# Patient Record
Sex: Female | Born: 1970 | Race: White | Hispanic: No | Marital: Married | Smoking: Never smoker
Health system: Southern US, Community
[De-identification: ages and names within clinical notes are randomized; demographics above are authoritative.]

## PROBLEM LIST (undated history)

## (undated) DIAGNOSIS — E079 Disorder of thyroid, unspecified: Secondary | ICD-10-CM

## (undated) DIAGNOSIS — F32A Depression, unspecified: Secondary | ICD-10-CM

## (undated) DIAGNOSIS — F329 Major depressive disorder, single episode, unspecified: Secondary | ICD-10-CM

## (undated) HISTORY — PX: APPENDECTOMY: SHX54

## (undated) HISTORY — PX: CHOLECYSTECTOMY: SHX55

---

## 1998-02-09 ENCOUNTER — Inpatient Hospital Stay (HOSPITAL_COMMUNITY): Admission: AD | Admit: 1998-02-09 | Discharge: 1998-02-09 | Payer: Self-pay | Admitting: Gynecology

## 1998-03-12 ENCOUNTER — Encounter: Admission: RE | Admit: 1998-03-12 | Discharge: 1998-06-10 | Payer: Self-pay | Admitting: Gynecology

## 1998-04-24 ENCOUNTER — Inpatient Hospital Stay (HOSPITAL_COMMUNITY): Admission: AD | Admit: 1998-04-24 | Discharge: 1998-04-24 | Payer: Self-pay | Admitting: Gynecology

## 1998-04-24 ENCOUNTER — Observation Stay (HOSPITAL_COMMUNITY): Admission: AD | Admit: 1998-04-24 | Discharge: 1998-04-25 | Payer: Self-pay | Admitting: Obstetrics and Gynecology

## 1998-05-30 ENCOUNTER — Other Ambulatory Visit: Admission: RE | Admit: 1998-05-30 | Discharge: 1998-05-30 | Payer: Self-pay | Admitting: Gynecology

## 1998-05-30 ENCOUNTER — Inpatient Hospital Stay (HOSPITAL_COMMUNITY): Admission: AD | Admit: 1998-05-30 | Discharge: 1998-06-01 | Payer: Self-pay | Admitting: Gynecology

## 1998-06-02 ENCOUNTER — Inpatient Hospital Stay (HOSPITAL_COMMUNITY): Admission: AD | Admit: 1998-06-02 | Discharge: 1998-06-06 | Payer: Self-pay | Admitting: Gynecology

## 1998-06-28 ENCOUNTER — Inpatient Hospital Stay (HOSPITAL_COMMUNITY): Admission: AD | Admit: 1998-06-28 | Discharge: 1998-06-28 | Payer: Self-pay | Admitting: Obstetrics and Gynecology

## 1998-07-03 ENCOUNTER — Inpatient Hospital Stay (HOSPITAL_COMMUNITY): Admission: AD | Admit: 1998-07-03 | Discharge: 1998-07-05 | Payer: Self-pay | Admitting: Obstetrics and Gynecology

## 1998-07-05 ENCOUNTER — Encounter (HOSPITAL_COMMUNITY): Admission: RE | Admit: 1998-07-05 | Discharge: 1998-10-03 | Payer: Self-pay | Admitting: Obstetrics and Gynecology

## 1998-08-12 ENCOUNTER — Other Ambulatory Visit: Admission: RE | Admit: 1998-08-12 | Discharge: 1998-08-12 | Payer: Self-pay | Admitting: Obstetrics and Gynecology

## 1998-10-04 ENCOUNTER — Encounter (HOSPITAL_COMMUNITY): Admission: RE | Admit: 1998-10-04 | Discharge: 1999-01-02 | Payer: Self-pay | Admitting: *Deleted

## 1999-12-19 ENCOUNTER — Other Ambulatory Visit: Admission: RE | Admit: 1999-12-19 | Discharge: 1999-12-19 | Payer: Self-pay | Admitting: Gynecology

## 2000-07-14 ENCOUNTER — Encounter: Admission: RE | Admit: 2000-07-14 | Discharge: 2000-10-12 | Payer: Self-pay | Admitting: Gynecology

## 2000-07-29 ENCOUNTER — Other Ambulatory Visit: Admission: RE | Admit: 2000-07-29 | Discharge: 2000-07-29 | Payer: Self-pay | Admitting: Gynecology

## 2000-11-14 ENCOUNTER — Inpatient Hospital Stay (HOSPITAL_COMMUNITY): Admission: AD | Admit: 2000-11-14 | Discharge: 2000-11-14 | Payer: Self-pay | Admitting: Gynecology

## 2001-02-01 ENCOUNTER — Inpatient Hospital Stay (HOSPITAL_COMMUNITY): Admission: AD | Admit: 2001-02-01 | Discharge: 2001-02-01 | Payer: Self-pay | Admitting: *Deleted

## 2001-02-03 ENCOUNTER — Inpatient Hospital Stay (HOSPITAL_COMMUNITY): Admission: AD | Admit: 2001-02-03 | Discharge: 2001-02-03 | Payer: Self-pay | Admitting: Gynecology

## 2001-02-14 ENCOUNTER — Inpatient Hospital Stay (HOSPITAL_COMMUNITY): Admission: AD | Admit: 2001-02-14 | Discharge: 2001-02-14 | Payer: Self-pay | Admitting: Gynecology

## 2001-02-22 ENCOUNTER — Inpatient Hospital Stay (HOSPITAL_COMMUNITY): Admission: AD | Admit: 2001-02-22 | Discharge: 2001-02-22 | Payer: Self-pay | Admitting: Gynecology

## 2001-03-04 ENCOUNTER — Inpatient Hospital Stay (HOSPITAL_COMMUNITY): Admission: AD | Admit: 2001-03-04 | Discharge: 2001-03-06 | Payer: Self-pay | Admitting: Gynecology

## 2001-04-12 ENCOUNTER — Encounter: Admission: RE | Admit: 2001-04-12 | Discharge: 2001-05-12 | Payer: Self-pay | Admitting: Gynecology

## 2001-04-14 ENCOUNTER — Other Ambulatory Visit: Admission: RE | Admit: 2001-04-14 | Discharge: 2001-04-14 | Payer: Self-pay | Admitting: Gynecology

## 2001-06-12 ENCOUNTER — Encounter: Admission: RE | Admit: 2001-06-12 | Discharge: 2001-07-12 | Payer: Self-pay | Admitting: Gynecology

## 2001-07-13 ENCOUNTER — Encounter: Admission: RE | Admit: 2001-07-13 | Discharge: 2001-08-12 | Payer: Self-pay | Admitting: Gynecology

## 2002-08-03 ENCOUNTER — Other Ambulatory Visit: Admission: RE | Admit: 2002-08-03 | Discharge: 2002-08-03 | Payer: Self-pay | Admitting: Gynecology

## 2010-02-04 ENCOUNTER — Encounter
Admission: RE | Admit: 2010-02-04 | Discharge: 2010-02-04 | Payer: Self-pay | Admitting: Physical Medicine & Rehabilitation

## 2011-03-20 NOTE — Discharge Summary (Signed)
[facility]  PatientSolae Fuller, [place]                           MRN: [id number] Adm. Date:  [id number] Disc. Date: [id number] Attending:  Douglass Rivers Dictator:   Antony Contras, [place]                           Discharge Summary  DISCHARGE DIAGNOSES:          1. Intrauterine pregnancy at 40 weeks.                               2. Advanced cervical dilatation.  PROCEDURES:                   1. Induction of labor secondary to advanced                                  dilatation.                               2. Precipitous vaginal delivery of viable                                  infant over intact perineum.  HISTORY OF PRESENT ILLNESS:   The patient is a 40 year old, gravida 2, para 1-0-0-1 with an LMP of June 01, 2000 and [facility] Mar 08, 2001. Prenatal risk factors include a history of preterm labor, history of bulimia, anxiety and depression. The patient was also taking Paxil 20 mg q.d. during the current pregnancy, and was on terbutaline for preterm labor.  PRENATAL LABORATORY DATA:     Blood type O positive, antibody screen negative. RPR, HBsAg, HIV nonreactive. Toxoplasmosis negative, rubella immune. MSAFP normal. GBS is negative.  HOSPITAL COURSE/TREATMENT:    The patient had presented to the office on a routine visit and found to be 4 cm, 75% effaced with ballotable cervix, and since she does live 40 minutes away, was admitted for induction of labor. She did progress to complete dilatation and then precipitously delivered a viable, Apgar 9/9, 6-pound 15-ounce infant over an intact perineum.  Postpartum course:  She remained afebrile, had no difficulty voiding, and was able to be discharged on her second postpartum day in satisfactory condition. CBC: Hematocrit 38.7, hemoglobin 13.3, WBC 17.4, platelets 199,000.  DISCHARGE FOLLOWUP:           The patient is to follow up in six weeks.  DISCHARGE MEDICATIONS:        The patient is to continue  with prenatal vitamins and iron with Motrin and Tylox for pain. DD:  03/21/01 TD:  03/21/01 Job: [id number]

## 2011-03-20 NOTE — H&P (Signed)
[facility]  PatientLashawnda Fuller, [place]                           MRN: [id number] Adm. Date:  [id number] Disc. Date: [id number] Attending:  Tonye Royalty                         History and Physical  CHIEF COMPLAINT:              A 40-week intrauterine pregnancy, advanced cervical dilation.  HISTORY OF PRESENT ILLNESS:   The patient is a 40 year old, G2, P1-0-0-1, with an LMP of June 01, 2000, estimated date of confinement Mar 08, 2001, estimated gestational age of 39-4/7 weeks, who was noted in the office on a routine examination to be 4 cm dilated, 75% effaced, with a ballottable vertex.  The patients first labor was 4 hours, had she expressed some concerns with regard to her advanced dilation.  She does live 40 minutes away.  The patient is, therefore, being admitted electively for advanced dilation at term.  Her pregnancy was complicated by history of preterm labor which was treated with terbutaline until term.  She has a history of anorexia bulimia as well as depression and did relapse during this pregnancy, and she is currently on Paxil 20 mg.  The remainder of her pregnancy has thus far been uncomplicated. She denies any vaginal bleeding, contractions, and reports good fetal movement.  PRENATAL LABORATORY DATA:     She is O positive, antibody negative, RPR nonreactive, HIV nonreactive, hepatitis B surface antigen nonreactive, toxoplasmosis negative, GBS negative.  Please refer to the [facility] for the complete history.  PHYSICAL EXAMINATION:  GENERAL:                      Well-developed female in no acute distress.  VITAL SIGNS:                  Blood pressure 120/80, weight 172.  HEENT:                        Unremarkable.  NECK:                         Thyroid nontender and mobile.  HEART:                        Regular rate.  LUNGS:                        Clear to auscultation.  BREASTS:                      Without mass,  discharge, or retraction, ______  ABDOMEN:                      Gravid with a fundal height of 38.5.  Soft and nontender.  Fetal heart tones auscultated.  PELVIC:                       On vaginal exam, she is 4, 75, and ballottable.  EXTREMITIES:                  No edema.  Normal reflexes.  ASSESSMENT:  At 39+ weeks, 4 cm dilated, with history of rapid labor in the past.  We will present electively for Pitocin induction as well as AROM.  All questions were addressed.  She will present to the hospital before this time should she go into labor in the interim. DD:  03/02/01 TD:  03/03/01 Job: [id number]

## 2012-10-13 ENCOUNTER — Ambulatory Visit (INDEPENDENT_AMBULATORY_CARE_PROVIDER_SITE_OTHER): Payer: Managed Care, Other (non HMO)

## 2012-10-13 ENCOUNTER — Other Ambulatory Visit: Payer: Self-pay | Admitting: Unknown Physician Specialty

## 2012-10-13 DIAGNOSIS — R1110 Vomiting, unspecified: Secondary | ICD-10-CM

## 2012-10-13 DIAGNOSIS — R109 Unspecified abdominal pain: Secondary | ICD-10-CM

## 2012-10-13 DIAGNOSIS — R52 Pain, unspecified: Secondary | ICD-10-CM

## 2012-10-19 ENCOUNTER — Other Ambulatory Visit: Payer: Self-pay | Admitting: Unknown Physician Specialty

## 2012-10-19 DIAGNOSIS — R52 Pain, unspecified: Secondary | ICD-10-CM

## 2012-10-25 ENCOUNTER — Other Ambulatory Visit: Payer: Managed Care, Other (non HMO)

## 2012-10-28 ENCOUNTER — Ambulatory Visit (INDEPENDENT_AMBULATORY_CARE_PROVIDER_SITE_OTHER): Payer: Managed Care, Other (non HMO)

## 2012-10-28 DIAGNOSIS — R1011 Right upper quadrant pain: Secondary | ICD-10-CM

## 2012-10-28 DIAGNOSIS — K8020 Calculus of gallbladder without cholecystitis without obstruction: Secondary | ICD-10-CM

## 2012-10-28 DIAGNOSIS — R52 Pain, unspecified: Secondary | ICD-10-CM

## 2012-10-28 DIAGNOSIS — R112 Nausea with vomiting, unspecified: Secondary | ICD-10-CM

## 2013-08-29 ENCOUNTER — Emergency Department (HOSPITAL_COMMUNITY)
Admission: EM | Admit: 2013-08-29 | Discharge: 2013-08-29 | Disposition: A | Discharge status: Home / Self Care | Payer: Managed Care, Other (non HMO) | Attending: Emergency Medicine | Admitting: Emergency Medicine

## 2013-08-29 ENCOUNTER — Encounter (HOSPITAL_COMMUNITY): Payer: Self-pay | Admitting: Emergency Medicine

## 2013-08-29 DIAGNOSIS — F3289 Other specified depressive episodes: Secondary | ICD-10-CM | POA: Insufficient documentation

## 2013-08-29 DIAGNOSIS — E079 Disorder of thyroid, unspecified: Secondary | ICD-10-CM | POA: Insufficient documentation

## 2013-08-29 DIAGNOSIS — Z79899 Other long term (current) drug therapy: Secondary | ICD-10-CM | POA: Insufficient documentation

## 2013-08-29 DIAGNOSIS — Z3202 Encounter for pregnancy test, result negative: Secondary | ICD-10-CM | POA: Insufficient documentation

## 2013-08-29 DIAGNOSIS — M7989 Other specified soft tissue disorders: Secondary | ICD-10-CM | POA: Insufficient documentation

## 2013-08-29 DIAGNOSIS — R55 Syncope and collapse: Secondary | ICD-10-CM

## 2013-08-29 DIAGNOSIS — F329 Major depressive disorder, single episode, unspecified: Secondary | ICD-10-CM | POA: Insufficient documentation

## 2013-08-29 DIAGNOSIS — Z880 Allergy status to penicillin: Secondary | ICD-10-CM | POA: Insufficient documentation

## 2013-08-29 HISTORY — DX: Disorder of thyroid, unspecified: E07.9

## 2013-08-29 HISTORY — DX: Major depressive disorder, single episode, unspecified: F32.9

## 2013-08-29 HISTORY — DX: Depression, unspecified: F32.A

## 2013-08-29 LAB — GLUCOSE, CAPILLARY: Glucose-Capillary: 97 mg/dL (ref 70–99)

## 2013-08-29 LAB — POCT I-STAT, CHEM 8
BUN: 19 mg/dL (ref 6–23)
Calcium, Ion: 1.2 mmol/L (ref 1.12–1.23)
Chloride: 100 mEq/L (ref 96–112)
Creatinine, Ser: 1.2 mg/dL — ABNORMAL HIGH (ref 0.50–1.10)
Glucose, Bld: 96 mg/dL (ref 70–99)
HCT: 44 % (ref 36.0–46.0)
Hemoglobin: 15 g/dL (ref 12.0–15.0)
Potassium: 3.7 mEq/L (ref 3.5–5.1)
Sodium: 139 mEq/L (ref 135–145)
TCO2: 28 mmol/L (ref 0–100)

## 2013-08-29 LAB — COMPREHENSIVE METABOLIC PANEL
ALT: 15 U/L (ref 0–35)
AST: 24 U/L (ref 0–37)
Albumin: 4 g/dL (ref 3.5–5.2)
Alkaline Phosphatase: 61 U/L (ref 39–117)
BUN: 18 mg/dL (ref 6–23)
CO2: 29 mEq/L (ref 19–32)
Calcium: 9.6 mg/dL (ref 8.4–10.5)
Chloride: 100 mEq/L (ref 96–112)
Creatinine, Ser: 0.99 mg/dL (ref 0.50–1.10)
GFR calc Af Amer: 80 mL/min — ABNORMAL LOW (ref >90)
GFR calc non Af Amer: 69 mL/min — ABNORMAL LOW (ref >90)
Glucose, Bld: 99 mg/dL (ref 70–99)
Potassium: 3.7 mEq/L (ref 3.5–5.1)
Sodium: 135 mEq/L (ref 135–145)
Total Bilirubin: 0.4 mg/dL (ref 0.3–1.2)
Total Protein: 7.2 g/dL (ref 6.0–8.3)

## 2013-08-29 LAB — CBC
HCT: 39.2 % (ref 36.0–46.0)
Hemoglobin: 13.7 g/dL (ref 12.0–15.0)
MCH: 31.6 pg (ref 26.0–34.0)
MCHC: 34.9 g/dL (ref 30.0–36.0)
MCV: 90.3 fL (ref 78.0–100.0)
Platelets: 229 10*3/uL (ref 150–400)
RBC: 4.34 MIL/uL (ref 3.87–5.11)
RDW: 12.7 % (ref 11.5–15.5)
WBC: 6.4 10*3/uL (ref 4.0–10.5)

## 2013-08-29 LAB — URINALYSIS, ROUTINE W REFLEX MICROSCOPIC
Bilirubin Urine: NEGATIVE
Glucose, UA: NEGATIVE mg/dL
Hgb urine dipstick: NEGATIVE
Ketones, ur: NEGATIVE mg/dL
Leukocytes, UA: NEGATIVE
Nitrite: NEGATIVE
Protein, ur: NEGATIVE mg/dL
Specific Gravity, Urine: 1.011 (ref 1.005–1.030)
Urobilinogen, UA: 0.2 mg/dL (ref 0.0–1.0)
pH: 7 (ref 5.0–8.0)

## 2013-08-29 LAB — POCT PREGNANCY, URINE: Preg Test, Ur: NEGATIVE

## 2013-08-29 NOTE — ED Provider Notes (Signed)
Medical screening examination/treatment/procedure(s) were performed by non-physician practitioner and as supervising physician I was immediately available for consultation/collaboration.  EKG Interpretation   None        Raeford Razor, MD 08/29/13 [phone]

## 2013-08-29 NOTE — ED Provider Notes (Signed)
CSN: [id number]     Arrival date & time 08/29/13  1146 History   First MD Initiated Contact with Patient 08/29/13 1324     Chief Complaint  Patient presents with  . Loss of Consciousness   (Consider location/radiation/quality/duration/timing/severity/associated sxs/prior Treatment) Patient is a 42 y.o. female presenting with syncope. The history is provided by the patient. No language interpreter was used.  Loss of Consciousness Episode history:  Multiple Most recent episode:  Today Associated symptoms: no chest pain, no fever, no headaches, no nausea, no shortness of breath, no vomiting and no weakness   Associated symptoms comment:  She received the flu shot today. Per the patient, there was a large amount of bleeding associated with the injection and she subsequently passed out. She woke and then passed out again. She states the same thing occurred last time she received the shot as well. No pain. No dizziness prior to getting the injection. She now complains only of swelling without pain at the site in the right upper arm. No nausea, vomiting, headache, chest pain or shortness of breath.    Past Medical History  Diagnosis Date  . Thyroid disease   . Depression    Past Surgical History  Procedure Laterality Date  . Cholecystectomy    . Appendectomy     No family history on file. History  Substance Use Topics  . Smoking status: Never Smoker   . Smokeless tobacco: Not on file  . Alcohol Use: No   OB History   Grav Para Term Preterm Abortions TAB SAB Ect Mult Living                 Review of Systems  Constitutional: Negative for fever and chills.  HENT: Negative.   Eyes: Negative.  Negative for visual disturbance.  Respiratory: Negative.  Negative for shortness of breath.   Cardiovascular: Positive for syncope. Negative for chest pain.  Gastrointestinal: Negative.  Negative for nausea, vomiting and abdominal pain.  Musculoskeletal: Negative.  Negative for neck pain.   Skin: Negative.  Negative for rash.  Neurological: Positive for syncope. Negative for weakness and headaches.    Allergies  Penicillins  Home Medications   Current Outpatient Rx  Name  Route  Sig  Dispense  Refill  . BIOTIN PO   Oral   Take 1 tablet by mouth daily.         Marland Kitchen levothyroxine (SYNTHROID, LEVOTHROID) 25 MCG tablet   Oral   Take 1 tablet by mouth daily.         . Misc Natural Products (CORTISOL PO)   Oral   Take 1 tablet by mouth 2 (two) times daily.         Marland Kitchen venlafaxine (EFFEXOR) 75 MG tablet   Oral   Take 1 tablet by mouth daily.          BP 118/68  Pulse 76  Temp(Src) 98.6 F (37 C) (Oral)  Resp 18  SpO2 99%  LMP 08/15/2013 Physical Exam  Constitutional: She is oriented to person, place, and time. She appears well-developed and well-nourished.  HENT:  Head: Normocephalic.  Eyes: Pupils are equal, round, and reactive to light.  Neck: Normal range of motion. Neck supple.  Cardiovascular: Normal rate and regular rhythm.   Pulmonary/Chest: Effort normal and breath sounds normal.  Abdominal: Soft. Bowel sounds are normal. There is no tenderness. There is no rebound and no guarding.  Musculoskeletal: Normal range of motion.  Neurological: She is alert and oriented to  person, place, and time. She has normal strength and normal reflexes. No sensory deficit. She displays a negative Romberg sign. Coordination normal.  Skin: Skin is warm and dry. No rash noted.  Psychiatric: She has a normal mood and affect.    ED Course  Procedures (including critical care time) Labs Review Labs Reviewed  COMPREHENSIVE METABOLIC PANEL - Abnormal; Notable for the following:    GFR calc non Af Amer 69 (*)    GFR calc Af Amer 80 (*)    All other components within normal limits  POCT I-STAT, CHEM 8 - Abnormal; Notable for the following:    Creatinine, Ser 1.20 (*)    All other components within normal limits  CBC  URINALYSIS, ROUTINE W REFLEX MICROSCOPIC   GLUCOSE, CAPILLARY  POCT PREGNANCY, URINE   Results for orders placed during the hospital encounter of 08/29/13  CBC      Result Value Range   WBC 6.4  4.0 - 10.5 K/uL   RBC 4.34  3.87 - 5.11 MIL/uL   Hemoglobin 13.7  12.0 - 15.0 g/dL   HCT 16.1  09.6 - 04.5 %   MCV 90.3  78.0 - 100.0 fL   MCH 31.6  26.0 - 34.0 pg   MCHC 34.9  30.0 - 36.0 g/dL   RDW 40.9  81.1 - 91.4 %   Platelets 229  150 - 400 K/uL  COMPREHENSIVE METABOLIC PANEL      Result Value Range   Sodium 135  135 - 145 mEq/L   Potassium 3.7  3.5 - 5.1 mEq/L   Chloride 100  96 - 112 mEq/L   CO2 29  19 - 32 mEq/L   Glucose, Bld 99  70 - 99 mg/dL   BUN 18  6 - 23 mg/dL   Creatinine, Ser 7.82  0.50 - 1.10 mg/dL   Calcium 9.6  8.4 - 95.6 mg/dL   Total Protein 7.2  6.0 - 8.3 g/dL   Albumin 4.0  3.5 - 5.2 g/dL   AST 24  0 - 37 U/L   ALT 15  0 - 35 U/L   Alkaline Phosphatase 61  39 - 117 U/L   Total Bilirubin 0.4  0.3 - 1.2 mg/dL   GFR calc non Af Amer 69 (*) >90 mL/min   GFR calc Af Amer 80 (*) >90 mL/min  URINALYSIS, ROUTINE W REFLEX MICROSCOPIC      Result Value Range   Color, Urine YELLOW  YELLOW   APPearance CLEAR  CLEAR   Specific Gravity, Urine 1.011  1.005 - 1.030   pH 7.0  5.0 - 8.0   Glucose, UA NEGATIVE  NEGATIVE mg/dL   Hgb urine dipstick NEGATIVE  NEGATIVE   Bilirubin Urine NEGATIVE  NEGATIVE   Ketones, ur NEGATIVE  NEGATIVE mg/dL   Protein, ur NEGATIVE  NEGATIVE mg/dL   Urobilinogen, UA 0.2  0.0 - 1.0 mg/dL   Nitrite NEGATIVE  NEGATIVE   Leukocytes, UA NEGATIVE  NEGATIVE  GLUCOSE, CAPILLARY      Result Value Range   Glucose-Capillary 97  70 - 99 mg/dL   Comment 1 Notify RN     Comment 2 Documented in Chart    POCT PREGNANCY, URINE      Result Value Range   Preg Test, Ur NEGATIVE  NEGATIVE  POCT I-STAT, CHEM 8      Result Value Range   Sodium 139  135 - 145 mEq/L   Potassium 3.7  3.5 - 5.1 mEq/L  Chloride 100  96 - 112 mEq/L   BUN 19  6 - 23 mg/dL   Creatinine, Ser 9.56 (*) 0.50 - 1.10  mg/dL   Glucose, Bld 96  70 - 99 mg/dL   Calcium, Ion 2.13  1.12 - 1.23 mmol/L   TCO2 28  0 - 100 mmol/L   Hemoglobin 15.0  12.0 - 15.0 g/dL   HCT 08.6  57.8 - 46.9 %  No results found.   Imaging Review No results found.  EKG Interpretation   None      Date: 08/29/2013  Rate: 55  Rhythm: sinus bradycardia  QRS Axis: normal  Intervals: normal  ST/T Wave abnormalities: nonspecific T wave changes  Conduction Disutrbances:none  Narrative Interpretation:   Old EKG Reviewed: none available    MDM  No diagnosis found. 1. Syncope  She feels back to baseline now, no orthostasis, ambulatory without symptoms of dizziness, near syncope, or lightheadedness. She still has swelling at the shot injection site that is nontender. Reassured that this is felt to be localized reaction only.     Arnoldo Hooker, PA-C 08/29/13 1448

## 2013-08-29 NOTE — Progress Notes (Signed)
   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

## 2013-08-29 NOTE — ED Notes (Signed)
Per EMS: was called out for allergic reaction. Pt had flu shot 15 mins before EMS arrived. After the shot blood flew out of arm, and she passed out. Saff states pt passed out twice. Pt had no allergic reaction symptoms. NAD. EMS states no orthostatic changes.

## 2013-08-29 NOTE — ED Notes (Signed)
Bed: WA02 Expected date:  Expected time:  Means of arrival:  Comments: EMS allergic reaction 

## 2014-10-05 ENCOUNTER — Other Ambulatory Visit: Payer: Self-pay | Admitting: Unknown Physician Specialty

## 2014-10-05 DIAGNOSIS — D259 Leiomyoma of uterus, unspecified: Secondary | ICD-10-CM

## 2014-10-08 ENCOUNTER — Other Ambulatory Visit: Payer: Self-pay | Admitting: Unknown Physician Specialty

## 2014-10-08 DIAGNOSIS — D259 Leiomyoma of uterus, unspecified: Secondary | ICD-10-CM

## 2014-10-17 ENCOUNTER — Ambulatory Visit (INDEPENDENT_AMBULATORY_CARE_PROVIDER_SITE_OTHER): Payer: Managed Care, Other (non HMO)

## 2014-10-17 DIAGNOSIS — D251 Intramural leiomyoma of uterus: Secondary | ICD-10-CM

## 2014-10-17 DIAGNOSIS — D259 Leiomyoma of uterus, unspecified: Secondary | ICD-10-CM

## 2016-10-07 DIAGNOSIS — M25512 Pain in left shoulder: Secondary | ICD-10-CM | POA: Diagnosis not present

## 2016-10-20 DIAGNOSIS — M25512 Pain in left shoulder: Secondary | ICD-10-CM | POA: Diagnosis not present

## 2016-10-30 DIAGNOSIS — M25512 Pain in left shoulder: Secondary | ICD-10-CM | POA: Diagnosis not present

## 2016-11-12 DIAGNOSIS — J069 Acute upper respiratory infection, unspecified: Secondary | ICD-10-CM | POA: Diagnosis not present

## 2016-11-12 DIAGNOSIS — J9801 Acute bronchospasm: Secondary | ICD-10-CM | POA: Diagnosis not present

## 2016-12-01 ENCOUNTER — Other Ambulatory Visit: Payer: Self-pay | Admitting: Unknown Physician Specialty

## 2016-12-01 DIAGNOSIS — E041 Nontoxic single thyroid nodule: Secondary | ICD-10-CM

## 2016-12-02 DIAGNOSIS — Z79899 Other long term (current) drug therapy: Secondary | ICD-10-CM | POA: Diagnosis not present

## 2016-12-02 DIAGNOSIS — Z880 Allergy status to penicillin: Secondary | ICD-10-CM | POA: Diagnosis not present

## 2016-12-02 DIAGNOSIS — R002 Palpitations: Secondary | ICD-10-CM | POA: Diagnosis not present

## 2016-12-02 DIAGNOSIS — M47812 Spondylosis without myelopathy or radiculopathy, cervical region: Secondary | ICD-10-CM | POA: Diagnosis not present

## 2016-12-02 DIAGNOSIS — F329 Major depressive disorder, single episode, unspecified: Secondary | ICD-10-CM | POA: Diagnosis not present

## 2016-12-02 DIAGNOSIS — R0609 Other forms of dyspnea: Secondary | ICD-10-CM | POA: Diagnosis not present

## 2016-12-02 DIAGNOSIS — E039 Hypothyroidism, unspecified: Secondary | ICD-10-CM | POA: Diagnosis not present

## 2016-12-02 DIAGNOSIS — R42 Dizziness and giddiness: Secondary | ICD-10-CM | POA: Diagnosis not present

## 2016-12-02 DIAGNOSIS — R531 Weakness: Secondary | ICD-10-CM | POA: Diagnosis not present

## 2016-12-03 ENCOUNTER — Ambulatory Visit: Payer: BLUE CROSS/BLUE SHIELD

## 2016-12-03 DIAGNOSIS — E041 Nontoxic single thyroid nodule: Secondary | ICD-10-CM

## 2016-12-03 DIAGNOSIS — E042 Nontoxic multinodular goiter: Secondary | ICD-10-CM | POA: Diagnosis not present

## 2017-01-07 DIAGNOSIS — L501 Idiopathic urticaria: Secondary | ICD-10-CM | POA: Diagnosis not present

## 2017-01-07 DIAGNOSIS — R221 Localized swelling, mass and lump, neck: Secondary | ICD-10-CM | POA: Diagnosis not present

## 2017-01-07 DIAGNOSIS — E063 Autoimmune thyroiditis: Secondary | ICD-10-CM | POA: Diagnosis not present

## 2017-03-11 DIAGNOSIS — Z8719 Personal history of other diseases of the digestive system: Secondary | ICD-10-CM | POA: Diagnosis not present

## 2017-03-11 DIAGNOSIS — Z9049 Acquired absence of other specified parts of digestive tract: Secondary | ICD-10-CM | POA: Diagnosis not present

## 2017-03-11 DIAGNOSIS — K2900 Acute gastritis without bleeding: Secondary | ICD-10-CM | POA: Diagnosis not present

## 2017-03-11 DIAGNOSIS — R1013 Epigastric pain: Secondary | ICD-10-CM | POA: Diagnosis not present

## 2017-03-11 DIAGNOSIS — K2970 Gastritis, unspecified, without bleeding: Secondary | ICD-10-CM | POA: Diagnosis not present

## 2017-03-11 DIAGNOSIS — K769 Liver disease, unspecified: Secondary | ICD-10-CM | POA: Diagnosis not present

## 2017-03-11 DIAGNOSIS — R9431 Abnormal electrocardiogram [ECG] [EKG]: Secondary | ICD-10-CM | POA: Diagnosis not present

## 2017-03-11 DIAGNOSIS — R946 Abnormal results of thyroid function studies: Secondary | ICD-10-CM | POA: Diagnosis not present

## 2017-03-11 DIAGNOSIS — Z880 Allergy status to penicillin: Secondary | ICD-10-CM | POA: Diagnosis not present

## 2017-03-11 DIAGNOSIS — R001 Bradycardia, unspecified: Secondary | ICD-10-CM | POA: Diagnosis not present

## 2017-03-11 DIAGNOSIS — R079 Chest pain, unspecified: Secondary | ICD-10-CM | POA: Diagnosis not present

## 2017-03-12 DIAGNOSIS — K3189 Other diseases of stomach and duodenum: Secondary | ICD-10-CM | POA: Diagnosis not present

## 2017-03-12 DIAGNOSIS — K319 Disease of stomach and duodenum, unspecified: Secondary | ICD-10-CM | POA: Diagnosis not present

## 2017-03-12 DIAGNOSIS — K769 Liver disease, unspecified: Secondary | ICD-10-CM | POA: Diagnosis not present

## 2017-03-12 DIAGNOSIS — K2970 Gastritis, unspecified, without bleeding: Secondary | ICD-10-CM | POA: Diagnosis not present

## 2017-03-12 DIAGNOSIS — R1013 Epigastric pain: Secondary | ICD-10-CM | POA: Diagnosis not present

## 2017-03-12 DIAGNOSIS — K5900 Constipation, unspecified: Secondary | ICD-10-CM | POA: Diagnosis not present

## 2017-03-12 DIAGNOSIS — Z9049 Acquired absence of other specified parts of digestive tract: Secondary | ICD-10-CM | POA: Diagnosis not present

## 2017-03-20 DIAGNOSIS — H00012 Hordeolum externum right lower eyelid: Secondary | ICD-10-CM | POA: Diagnosis not present

## 2017-04-06 DIAGNOSIS — R1013 Epigastric pain: Secondary | ICD-10-CM | POA: Diagnosis not present

## 2017-04-06 DIAGNOSIS — R0789 Other chest pain: Secondary | ICD-10-CM | POA: Diagnosis not present

## 2017-04-07 DIAGNOSIS — F900 Attention-deficit hyperactivity disorder, predominantly inattentive type: Secondary | ICD-10-CM | POA: Diagnosis not present

## 2017-04-07 DIAGNOSIS — H00014 Hordeolum externum left upper eyelid: Secondary | ICD-10-CM | POA: Diagnosis not present

## 2017-04-07 DIAGNOSIS — M5030 Other cervical disc degeneration, unspecified cervical region: Secondary | ICD-10-CM | POA: Diagnosis not present

## 2017-04-07 DIAGNOSIS — J309 Allergic rhinitis, unspecified: Secondary | ICD-10-CM | POA: Diagnosis not present

## 2017-04-20 DIAGNOSIS — L570 Actinic keratosis: Secondary | ICD-10-CM | POA: Diagnosis not present

## 2017-04-23 DIAGNOSIS — H00024 Hordeolum internum left upper eyelid: Secondary | ICD-10-CM | POA: Diagnosis not present

## 2017-04-27 DIAGNOSIS — H00024 Hordeolum internum left upper eyelid: Secondary | ICD-10-CM | POA: Diagnosis not present

## 2017-05-19 DIAGNOSIS — J069 Acute upper respiratory infection, unspecified: Secondary | ICD-10-CM | POA: Diagnosis not present

## 2017-05-19 DIAGNOSIS — J9801 Acute bronchospasm: Secondary | ICD-10-CM | POA: Diagnosis not present

## 2017-06-01 DIAGNOSIS — J069 Acute upper respiratory infection, unspecified: Secondary | ICD-10-CM | POA: Diagnosis not present

## 2017-06-01 DIAGNOSIS — J9801 Acute bronchospasm: Secondary | ICD-10-CM | POA: Diagnosis not present

## 2017-06-17 DIAGNOSIS — L570 Actinic keratosis: Secondary | ICD-10-CM | POA: Diagnosis not present

## 2017-07-08 DIAGNOSIS — K2900 Acute gastritis without bleeding: Secondary | ICD-10-CM | POA: Diagnosis not present

## 2017-07-08 DIAGNOSIS — K8020 Calculus of gallbladder without cholecystitis without obstruction: Secondary | ICD-10-CM | POA: Diagnosis not present

## 2017-07-08 DIAGNOSIS — R1013 Epigastric pain: Secondary | ICD-10-CM | POA: Diagnosis not present

## 2017-07-12 DIAGNOSIS — L501 Idiopathic urticaria: Secondary | ICD-10-CM | POA: Diagnosis not present

## 2017-07-12 DIAGNOSIS — E063 Autoimmune thyroiditis: Secondary | ICD-10-CM | POA: Diagnosis not present

## 2017-08-10 DIAGNOSIS — L570 Actinic keratosis: Secondary | ICD-10-CM | POA: Diagnosis not present

## 2017-09-08 DIAGNOSIS — M5412 Radiculopathy, cervical region: Secondary | ICD-10-CM | POA: Diagnosis not present

## 2017-09-08 DIAGNOSIS — T481X5A Adverse effect of skeletal muscle relaxants [neuromuscular blocking agents], initial encounter: Secondary | ICD-10-CM | POA: Diagnosis not present

## 2017-09-16 DIAGNOSIS — H0014 Chalazion left upper eyelid: Secondary | ICD-10-CM | POA: Diagnosis not present

## 2017-09-28 DIAGNOSIS — H0014 Chalazion left upper eyelid: Secondary | ICD-10-CM | POA: Diagnosis not present

## 2017-10-05 DIAGNOSIS — H0014 Chalazion left upper eyelid: Secondary | ICD-10-CM | POA: Diagnosis not present

## 2017-10-14 DIAGNOSIS — Z9882 Breast implant status: Secondary | ICD-10-CM | POA: Diagnosis not present

## 2017-10-14 DIAGNOSIS — H0014 Chalazion left upper eyelid: Secondary | ICD-10-CM | POA: Diagnosis not present

## 2017-10-28 DIAGNOSIS — E041 Nontoxic single thyroid nodule: Secondary | ICD-10-CM | POA: Diagnosis not present

## 2017-10-28 DIAGNOSIS — F900 Attention-deficit hyperactivity disorder, predominantly inattentive type: Secondary | ICD-10-CM | POA: Diagnosis not present

## 2017-10-28 DIAGNOSIS — R635 Abnormal weight gain: Secondary | ICD-10-CM | POA: Diagnosis not present

## 2017-10-28 DIAGNOSIS — M5030 Other cervical disc degeneration, unspecified cervical region: Secondary | ICD-10-CM | POA: Diagnosis not present

## 2017-10-28 DIAGNOSIS — Z23 Encounter for immunization: Secondary | ICD-10-CM | POA: Diagnosis not present

## 2017-10-28 DIAGNOSIS — Z1231 Encounter for screening mammogram for malignant neoplasm of breast: Secondary | ICD-10-CM | POA: Diagnosis not present

## 2017-10-28 DIAGNOSIS — Z0000 Encounter for general adult medical examination without abnormal findings: Secondary | ICD-10-CM | POA: Diagnosis not present

## 2017-10-29 DIAGNOSIS — Z1231 Encounter for screening mammogram for malignant neoplasm of breast: Secondary | ICD-10-CM | POA: Diagnosis not present

## 2018-01-10 DIAGNOSIS — R221 Localized swelling, mass and lump, neck: Secondary | ICD-10-CM | POA: Diagnosis not present

## 2018-01-10 DIAGNOSIS — E063 Autoimmune thyroiditis: Secondary | ICD-10-CM | POA: Diagnosis not present

## 2018-01-10 DIAGNOSIS — L501 Idiopathic urticaria: Secondary | ICD-10-CM | POA: Diagnosis not present

## 2018-01-24 DIAGNOSIS — Z23 Encounter for immunization: Secondary | ICD-10-CM | POA: Diagnosis not present

## 2018-04-19 DIAGNOSIS — M5030 Other cervical disc degeneration, unspecified cervical region: Secondary | ICD-10-CM | POA: Diagnosis not present

## 2018-04-19 DIAGNOSIS — E041 Nontoxic single thyroid nodule: Secondary | ICD-10-CM | POA: Diagnosis not present

## 2018-04-19 DIAGNOSIS — F900 Attention-deficit hyperactivity disorder, predominantly inattentive type: Secondary | ICD-10-CM | POA: Diagnosis not present

## 2018-04-19 DIAGNOSIS — E7800 Pure hypercholesterolemia, unspecified: Secondary | ICD-10-CM | POA: Diagnosis not present

## 2018-04-22 DIAGNOSIS — E7800 Pure hypercholesterolemia, unspecified: Secondary | ICD-10-CM | POA: Diagnosis not present

## 2018-04-22 DIAGNOSIS — L309 Dermatitis, unspecified: Secondary | ICD-10-CM | POA: Diagnosis not present

## 2018-04-22 DIAGNOSIS — F900 Attention-deficit hyperactivity disorder, predominantly inattentive type: Secondary | ICD-10-CM | POA: Diagnosis not present

## 2018-04-22 DIAGNOSIS — J309 Allergic rhinitis, unspecified: Secondary | ICD-10-CM | POA: Diagnosis not present

## 2018-05-31 DIAGNOSIS — D229 Melanocytic nevi, unspecified: Secondary | ICD-10-CM | POA: Diagnosis not present

## 2018-07-13 DIAGNOSIS — E063 Autoimmune thyroiditis: Secondary | ICD-10-CM | POA: Diagnosis not present

## 2018-07-13 DIAGNOSIS — L501 Idiopathic urticaria: Secondary | ICD-10-CM | POA: Diagnosis not present

## 2018-07-13 DIAGNOSIS — R221 Localized swelling, mass and lump, neck: Secondary | ICD-10-CM | POA: Diagnosis not present

## 2018-08-04 DIAGNOSIS — F509 Eating disorder, unspecified: Secondary | ICD-10-CM | POA: Diagnosis not present

## 2018-08-09 DIAGNOSIS — F509 Eating disorder, unspecified: Secondary | ICD-10-CM | POA: Diagnosis not present

## 2018-09-13 DIAGNOSIS — G479 Sleep disorder, unspecified: Secondary | ICD-10-CM | POA: Diagnosis not present

## 2018-09-13 DIAGNOSIS — F418 Other specified anxiety disorders: Secondary | ICD-10-CM | POA: Diagnosis not present

## 2018-09-13 DIAGNOSIS — F502 Bulimia nervosa: Secondary | ICD-10-CM | POA: Diagnosis not present

## 2018-09-22 DIAGNOSIS — F3340 Major depressive disorder, recurrent, in remission, unspecified: Secondary | ICD-10-CM | POA: Diagnosis not present

## 2018-09-22 DIAGNOSIS — F4310 Post-traumatic stress disorder, unspecified: Secondary | ICD-10-CM | POA: Diagnosis not present

## 2018-10-07 DIAGNOSIS — Z01419 Encounter for gynecological examination (general) (routine) without abnormal findings: Secondary | ICD-10-CM | POA: Diagnosis not present

## 2018-10-07 DIAGNOSIS — Z0000 Encounter for general adult medical examination without abnormal findings: Secondary | ICD-10-CM | POA: Diagnosis not present

## 2018-10-07 DIAGNOSIS — Z1231 Encounter for screening mammogram for malignant neoplasm of breast: Secondary | ICD-10-CM | POA: Diagnosis not present

## 2018-11-11 DIAGNOSIS — F3340 Major depressive disorder, recurrent, in remission, unspecified: Secondary | ICD-10-CM | POA: Diagnosis not present

## 2018-11-11 DIAGNOSIS — F4310 Post-traumatic stress disorder, unspecified: Secondary | ICD-10-CM | POA: Diagnosis not present

## 2018-11-21 DIAGNOSIS — F418 Other specified anxiety disorders: Secondary | ICD-10-CM | POA: Diagnosis not present

## 2018-11-21 DIAGNOSIS — L309 Dermatitis, unspecified: Secondary | ICD-10-CM | POA: Diagnosis not present

## 2018-11-21 DIAGNOSIS — G479 Sleep disorder, unspecified: Secondary | ICD-10-CM | POA: Diagnosis not present

## 2018-11-21 DIAGNOSIS — F502 Bulimia nervosa: Secondary | ICD-10-CM | POA: Diagnosis not present

## 2018-11-25 DIAGNOSIS — F3340 Major depressive disorder, recurrent, in remission, unspecified: Secondary | ICD-10-CM | POA: Diagnosis not present

## 2018-11-25 DIAGNOSIS — F4310 Post-traumatic stress disorder, unspecified: Secondary | ICD-10-CM | POA: Diagnosis not present

## 2018-12-02 DIAGNOSIS — F4310 Post-traumatic stress disorder, unspecified: Secondary | ICD-10-CM | POA: Diagnosis not present

## 2018-12-02 DIAGNOSIS — F3340 Major depressive disorder, recurrent, in remission, unspecified: Secondary | ICD-10-CM | POA: Diagnosis not present

## 2018-12-16 DIAGNOSIS — Z780 Asymptomatic menopausal state: Secondary | ICD-10-CM | POA: Diagnosis not present

## 2018-12-16 DIAGNOSIS — Z1231 Encounter for screening mammogram for malignant neoplasm of breast: Secondary | ICD-10-CM | POA: Diagnosis not present

## 2018-12-16 DIAGNOSIS — Z13820 Encounter for screening for osteoporosis: Secondary | ICD-10-CM | POA: Diagnosis not present

## 2019-04-25 DIAGNOSIS — F418 Other specified anxiety disorders: Secondary | ICD-10-CM | POA: Diagnosis not present

## 2019-04-25 DIAGNOSIS — G479 Sleep disorder, unspecified: Secondary | ICD-10-CM | POA: Diagnosis not present

## 2019-04-25 DIAGNOSIS — L309 Dermatitis, unspecified: Secondary | ICD-10-CM | POA: Diagnosis not present

## 2019-04-25 DIAGNOSIS — F502 Bulimia nervosa: Secondary | ICD-10-CM | POA: Diagnosis not present

## 2019-05-09 DIAGNOSIS — M79631 Pain in right forearm: Secondary | ICD-10-CM | POA: Diagnosis not present

## 2019-07-19 DIAGNOSIS — R221 Localized swelling, mass and lump, neck: Secondary | ICD-10-CM | POA: Diagnosis not present

## 2019-07-19 DIAGNOSIS — L501 Idiopathic urticaria: Secondary | ICD-10-CM | POA: Diagnosis not present

## 2019-07-19 DIAGNOSIS — E063 Autoimmune thyroiditis: Secondary | ICD-10-CM | POA: Diagnosis not present

## 2019-07-26 DIAGNOSIS — R221 Localized swelling, mass and lump, neck: Secondary | ICD-10-CM | POA: Diagnosis not present

## 2019-07-26 DIAGNOSIS — E079 Disorder of thyroid, unspecified: Secondary | ICD-10-CM | POA: Diagnosis not present

## 2019-08-08 DIAGNOSIS — E041 Nontoxic single thyroid nodule: Secondary | ICD-10-CM | POA: Diagnosis not present

## 2019-08-09 DIAGNOSIS — E041 Nontoxic single thyroid nodule: Secondary | ICD-10-CM | POA: Diagnosis not present

## 2019-08-19 DIAGNOSIS — R110 Nausea: Secondary | ICD-10-CM | POA: Diagnosis not present

## 2019-08-19 DIAGNOSIS — Z20828 Contact with and (suspected) exposure to other viral communicable diseases: Secondary | ICD-10-CM | POA: Diagnosis not present

## 2019-08-19 DIAGNOSIS — R0981 Nasal congestion: Secondary | ICD-10-CM | POA: Diagnosis not present

## 2019-08-21 DIAGNOSIS — Z1159 Encounter for screening for other viral diseases: Secondary | ICD-10-CM | POA: Diagnosis not present

## 2019-08-25 DIAGNOSIS — E041 Nontoxic single thyroid nodule: Secondary | ICD-10-CM | POA: Diagnosis not present

## 2019-08-25 DIAGNOSIS — D440 Neoplasm of uncertain behavior of thyroid gland: Secondary | ICD-10-CM | POA: Diagnosis not present

## 2019-10-16 DIAGNOSIS — E063 Autoimmune thyroiditis: Secondary | ICD-10-CM | POA: Diagnosis not present

## 2019-10-20 DIAGNOSIS — M79631 Pain in right forearm: Secondary | ICD-10-CM | POA: Diagnosis not present

## 2019-10-28 DIAGNOSIS — M79631 Pain in right forearm: Secondary | ICD-10-CM | POA: Diagnosis not present

## 2020-08-08 ENCOUNTER — Emergency Department (INDEPENDENT_AMBULATORY_CARE_PROVIDER_SITE_OTHER)
Admission: EM | Admit: 2020-08-08 | Discharge: 2020-08-08 | Disposition: A | Discharge status: Home / Self Care | Payer: BC Managed Care – PPO | Source: Home / Self Care

## 2020-08-08 ENCOUNTER — Emergency Department (INDEPENDENT_AMBULATORY_CARE_PROVIDER_SITE_OTHER): Payer: BC Managed Care – PPO

## 2020-08-08 ENCOUNTER — Encounter: Payer: Self-pay | Admitting: Emergency Medicine

## 2020-08-08 ENCOUNTER — Other Ambulatory Visit: Payer: Self-pay

## 2020-08-08 DIAGNOSIS — R5383 Other fatigue: Secondary | ICD-10-CM | POA: Diagnosis not present

## 2020-08-08 DIAGNOSIS — R519 Headache, unspecified: Secondary | ICD-10-CM | POA: Diagnosis not present

## 2020-08-08 LAB — POCT URINALYSIS DIP (MANUAL ENTRY)
Bilirubin, UA: NEGATIVE
Glucose, UA: NEGATIVE mg/dL
Ketones, POC UA: NEGATIVE mg/dL
Leukocytes, UA: NEGATIVE
Nitrite, UA: NEGATIVE
Protein Ur, POC: NEGATIVE mg/dL
Spec Grav, UA: 1.025 (ref 1.010–1.025)
Urobilinogen, UA: 0.2 E.U./dL
pH, UA: 7 (ref 5.0–8.0)

## 2020-08-08 MED ORDER — ONDANSETRON 4 MG PO TBDP
4.0000 mg | ORAL_TABLET | Freq: Once | ORAL | Status: AC
  Administered 2020-08-08: 4 mg via ORAL

## 2020-08-08 MED ORDER — ACETAMINOPHEN 325 MG PO TABS
650.0000 mg | ORAL_TABLET | Freq: Once | ORAL | Status: AC
  Administered 2020-08-08: 650 mg via ORAL

## 2020-08-08 NOTE — ED Provider Notes (Signed)
[facility]    CSN: [id number] Arrival date & time: 08/08/20  1520      History   Chief Complaint Chief Complaint  Patient presents with  . Headache    HPI April Fuller is a 49 y.o. female.   HPI April Fuller is a 49 y.o. female presenting to UC with c/o generalized HA that is aching and throbbing, 8/10, worse than any headache she ever recalls having.  HA started about 1.5 hours PTA while she was at work.  She also reports having an elevated blood pressure at work with systolic being in the 68T. She took 3 Advil PTA without relief.  Denies photophobia but has mild nausea. No hx of migraines.  She does report mild Right side chest discomfort that started after the HA. No hx of heart disease. She does report her grandmother having a stroke in her 20s.   Pt denies weakness or numbness in arms or legs. No dizziness or change in vision. No recent URI symptoms. No head trauma.    Pt does report having decreased urine output today but denies pain with urination.   Past Medical History:  Diagnosis Date  . Depression   . Thyroid disease     There are no problems to display for this patient.   Past Surgical History:  Procedure Laterality Date  . APPENDECTOMY    . CHOLECYSTECTOMY      OB History   No obstetric history on file.      Home Medications    Prior to Admission medications   Medication Sig Start Date End Date Taking? Authorizing Provider  cholecalciferol (VITAMIN D3) 25 MCG (1000 UNIT) tablet Take 1,000 Units by mouth daily.   Yes [provider]  escitalopram (LEXAPRO) 20 MG tablet Take 20 mg by mouth daily.   Yes [provider]  traZODone (DESYREL) 100 MG tablet Take 100 mg by mouth at bedtime.   Yes [provider]  BIOTIN PO Take 1 tablet by mouth daily.    [provider]  Misc Natural Products (CORTISOL PO) Take 1 tablet by mouth 2 (two) times daily.    [provider]    Family History Family  History  Problem Relation Age of Onset  . Cancer Mother   . Thyroid disease Mother   . Hypertension Father     Social History Social History   Tobacco Use  . Smoking status: Never Smoker  . Smokeless tobacco: Never Used  Vaping Use  . Vaping Use: Never used  Substance Use Topics  . Alcohol use: No  . Drug use: Not Currently     Allergies   Penicillins   Review of Systems Review of Systems  Constitutional: Negative for chills and fever.  HENT: Negative for congestion, ear pain, sore throat, trouble swallowing and voice change.   Respiratory: Negative for cough and shortness of breath.   Cardiovascular: Positive for chest pain. Negative for palpitations.  Gastrointestinal: Positive for nausea. Negative for abdominal pain, diarrhea and vomiting.  Musculoskeletal: Negative for arthralgias, back pain and myalgias.  Skin: Negative for rash.  Neurological: Positive for headaches. Negative for dizziness and light-headedness.  All other systems reviewed and are negative.    Physical Exam Triage Vital Signs ED Triage Vitals  Enc Vitals Group     BP 08/08/20 1536 132/87     Pulse Rate 08/08/20 1536 60     Resp --      Temp 08/08/20 1536 98.2 F (  36.8 C)     Temp Source 08/08/20 1536 Oral     SpO2 08/08/20 1536 100 %     Weight 08/08/20 1537 140 lb (63.5 kg)     Height 08/08/20 1537 5\' 9"  (1.753 m)     Head Circumference --      Peak Flow --      Pain Score 08/08/20 1537 8     Pain Loc --      Pain Edu? --      Excl. in [place]? --    No data found.  Updated Vital Signs BP 132/87 (BP Location: Right Arm)   Pulse 60   Temp 98.2 F (36.8 C) (Oral)   Ht 5\' 9"  (1.753 m)   Wt 140 lb (63.5 kg)   LMP  (LMP Unknown)   SpO2 100%   BMI 20.67 kg/m   Visual Acuity Right Eye Distance: 20/70 Left Eye Distance: 20/70 Bilateral Distance: 20/70 (without correction, does not have glasses with her)  Right Eye Near:   Left Eye Near:    Bilateral Near:     Physical  Exam Vitals and nursing note reviewed.  Constitutional:      General: She is not in acute distress.    Appearance: She is well-developed. She is not ill-appearing, toxic-appearing or diaphoretic.  HENT:     Head: Normocephalic and atraumatic.     Mouth/Throat:     Mouth: Mucous membranes are moist.     Pharynx: Oropharynx is clear.  Eyes:     General: No visual field deficit.    Extraocular Movements: Extraocular movements intact.     Pupils: Pupils are equal, round, and reactive to light.  Cardiovascular:     Rate and Rhythm: Normal rate and regular rhythm.  Pulmonary:     Effort: Pulmonary effort is normal. No respiratory distress.     Breath sounds: Normal breath sounds. No stridor. No wheezing, rhonchi or rales.  Musculoskeletal:        General: Normal range of motion.     Cervical back: Normal range of motion and neck supple. No rigidity.  Lymphadenopathy:     Cervical: No cervical adenopathy.  Skin:    General: Skin is warm and dry.     Capillary Refill: Capillary refill takes less than 2 seconds.  Neurological:     Mental Status: She is alert and oriented to person, place, and time.     GCS: GCS eye subscore is 4. GCS verbal subscore is 5. GCS motor subscore is 6.     Cranial Nerves: No cranial nerve deficit, dysarthria or facial asymmetry.     Sensory: No sensory deficit.     Motor: No weakness.     Coordination: Romberg sign negative. Coordination normal.     Gait: Gait normal.  Psychiatric:        Mood and Affect: Mood normal.        Behavior: Behavior normal.      UC Treatments / Results  Labs (all labs ordered are listed, but only abnormal results are displayed) Labs Reviewed  POCT URINALYSIS DIP (MANUAL ENTRY) - Abnormal; Notable for the following components:      Result Value   Blood, UA trace-lysed (*)    All other components within normal limits    EKG Date/Time:08/08/2020   16:10:59 Ventricular Rate: 56 PR Interval: 196 QRS Duration: 86 QT  Interval: 452 QTC Calculation: 436 P-R-T axes: 65   39   76 Text Interpretation: Sinus bradycardia,  otherwise normal ECG    Radiology CT Head Wo Contrast  Result Date: 08/08/2020 CLINICAL DATA:  Head EXAM: CT HEAD WITHOUT CONTRAST TECHNIQUE: Contiguous axial images were obtained from the base of the skull through the vertex without intravenous contrast. COMPARISON:  None. FINDINGS: Brain: No evidence of acute infarction, hemorrhage, hydrocephalus, extra-axial collection or mass lesion/mass effect. Vascular: No hyperdense vessel or unexpected calcification. Skull: Normal. Negative for fracture or focal lesion. Sinuses/Orbits: No acute finding. Other: None. IMPRESSION: No acute intracranial abnormality. Electronically Signed   By: Valentino Saxon MD   On: 08/08/2020 16:47    Procedures Procedures (including critical care time)  Medications Ordered in UC Medications  ondansetron (ZOFRAN-ODT) disintegrating tablet 4 mg (4 mg Oral Given 08/08/20 1622)  acetaminophen (TYLENOL) tablet 650 mg (650 mg Oral Given 08/08/20 1622)    Initial Impression / Assessment and Plan / UC Course  I have reviewed the triage vital signs and the nursing notes.  Pertinent labs & imaging results that were available during my care of the patient were reviewed by me and considered in my medical decision making (see chart for details).     UA: no evidence of UTI Reassured pt of overall normal EKG and Head CT Normal neuro exam Pt reports pain improved from 8/10 to 2/10 by time of discharge Encouraged f/u with PCP next week as needed Discussed symptoms that warrant emergent care in the ED.  Final Clinical Impressions(s) / UC Diagnoses   Final diagnoses:  Fatigue, unspecified type  Generalized headache     Discharge Instructions      You may take 500mg  acetaminophen every 4-6 hours or in combination with ibuprofen 400-600mg  every 6-8 hours as needed for pain and inflammation.   Be sure to well  hydrated with clear liquids and get at least 8 hours of sleep at night, preferably more while sick.   Please follow up with family medicine next week if needed.  Call 911 or have someone drive you to the hospital if symptoms significantly worsening- worsening headache, change in vision, one sided weakness or numbness, dizziness/passing out or other new concerning symptoms develop.       ED Prescriptions    None     PDMP not reviewed this encounter.   Noe Gens, [place] 08/08/20 1823

## 2020-08-08 NOTE — Discharge Instructions (Signed)
  You may take 500mg  acetaminophen every 4-6 hours or in combination with ibuprofen 400-600mg  every 6-8 hours as needed for pain and inflammation.   Be sure to well hydrated with clear liquids and get at least 8 hours of sleep at night, preferably more while sick.   Please follow up with family medicine next week if needed.  Call 911 or have someone drive you to the hospital if symptoms significantly worsening- worsening headache, change in vision, one sided weakness or numbness, dizziness/passing out or other new concerning symptoms develop.

## 2020-08-08 NOTE — ED Triage Notes (Addendum)
Headache, nausea, Rapid Covid-Negative, started 1.5 hours ago. 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 She is vaccinated.

## 2022-03-20 IMAGING — CT CT HEAD W/O CM
3 of 4 series · 16 of 47 positions shown, 19 images · non-contrast
Comparison: None.

CLINICAL DATA: Head

EXAM:
CT HEAD WITHOUT CONTRAST
TECHNIQUE: Contiguous axial images were obtained from the base of the skull
through the vertex without intravenous contrast.

[Series 2: head wo · axial · 0.44mm/px · z∈[-84,+46]mm · 10 of 32 slices shown, 13 images]
[im 3/32  brain]
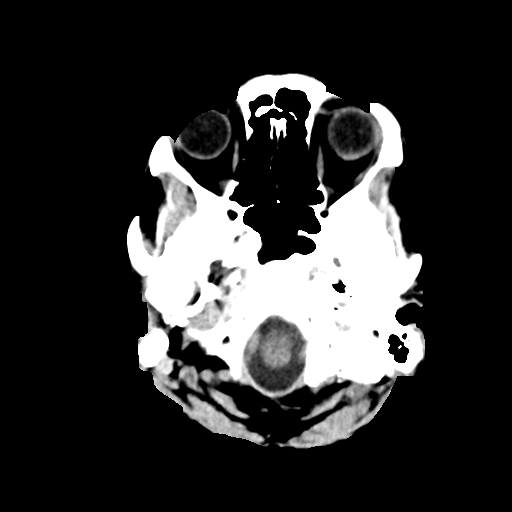
[im 3/32  bone]
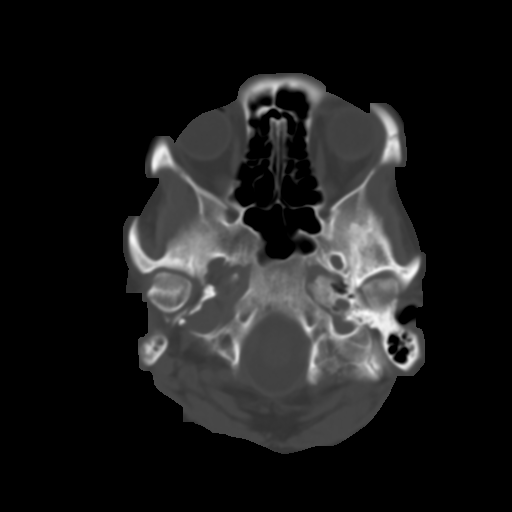
[im 5/32  brain]
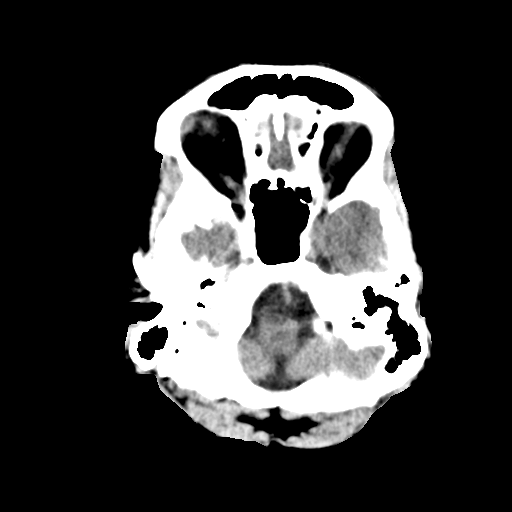
[im 9/32  brain]
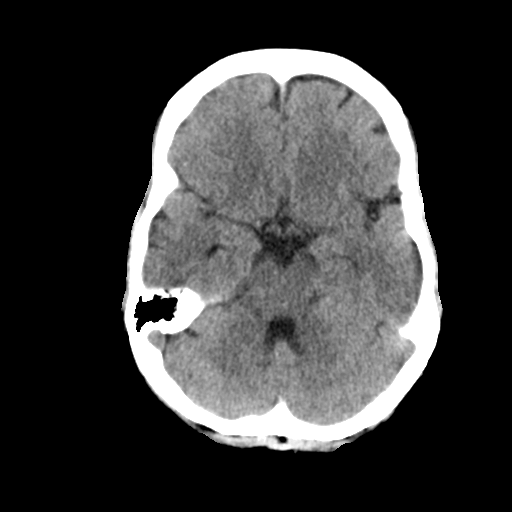
[im 12/32  brain]
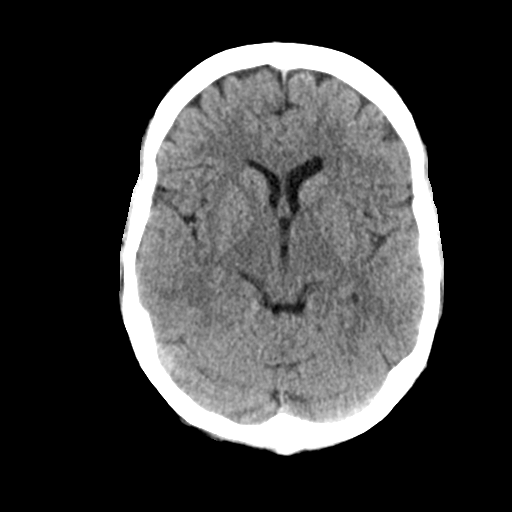
[im 14/32  brain]
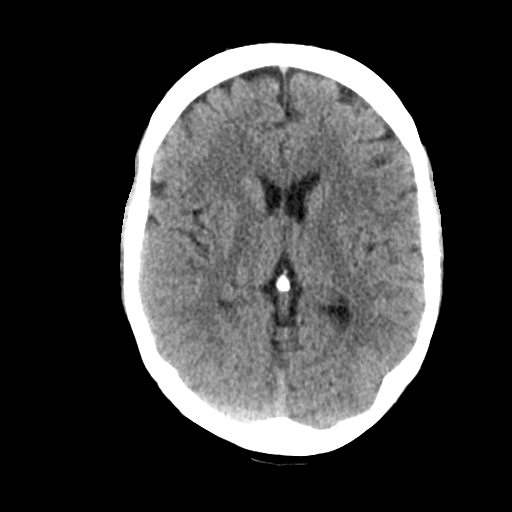
[im 14/32  bone]
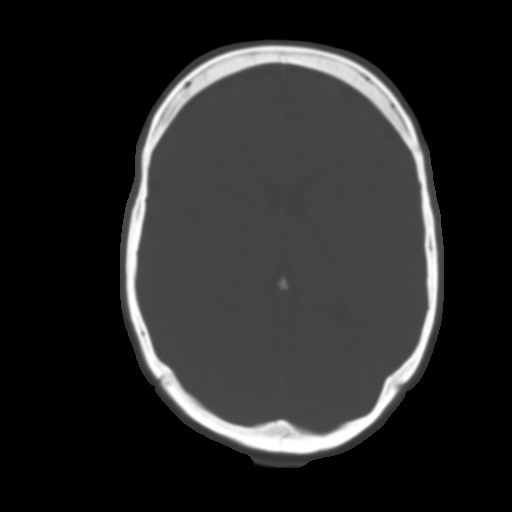
[im 18/32  brain]
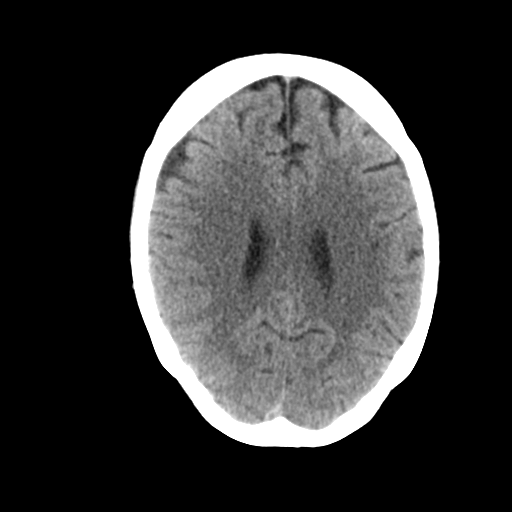
[im 20/32  brain]
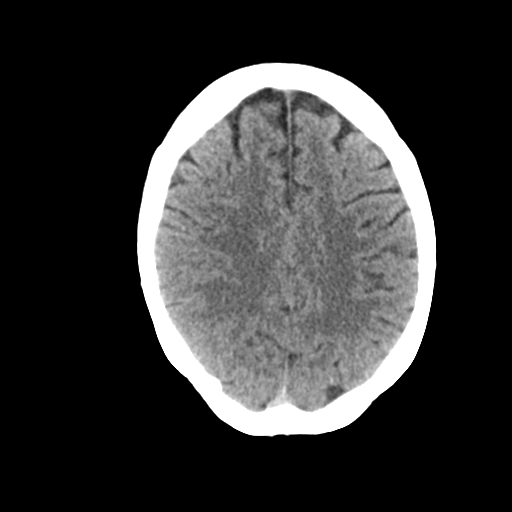
[im 23/32  brain]
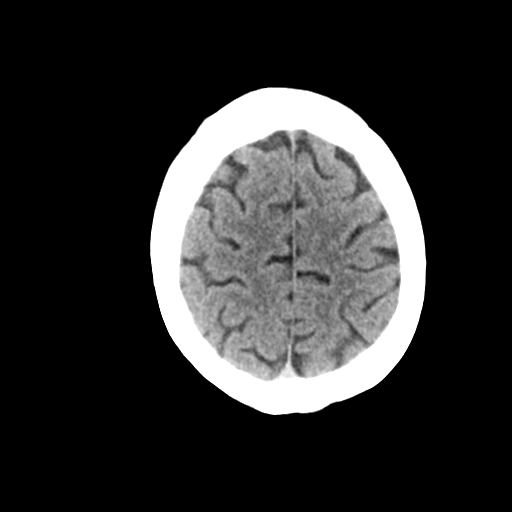
[im 27/32  brain]
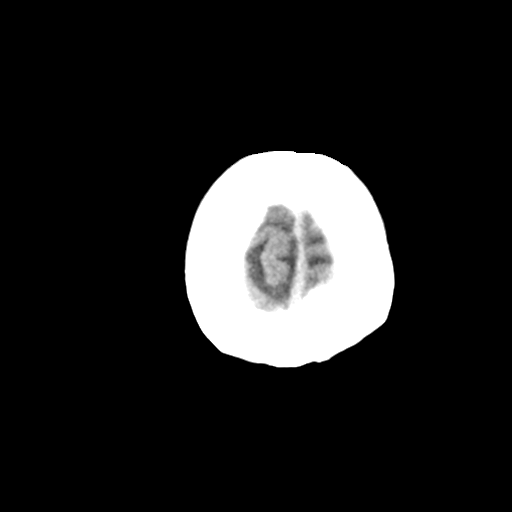
[im 27/32  bone]
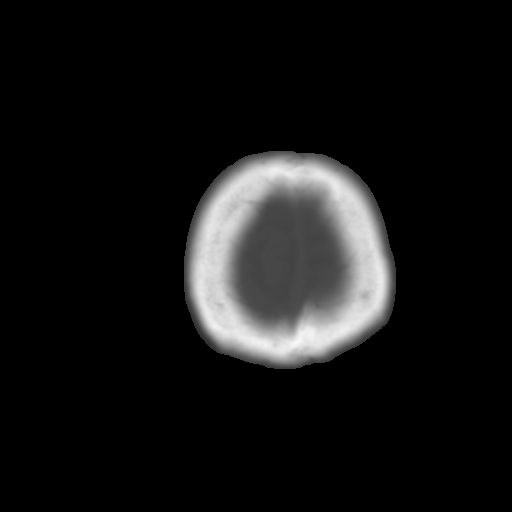
[im 29/32  brain]
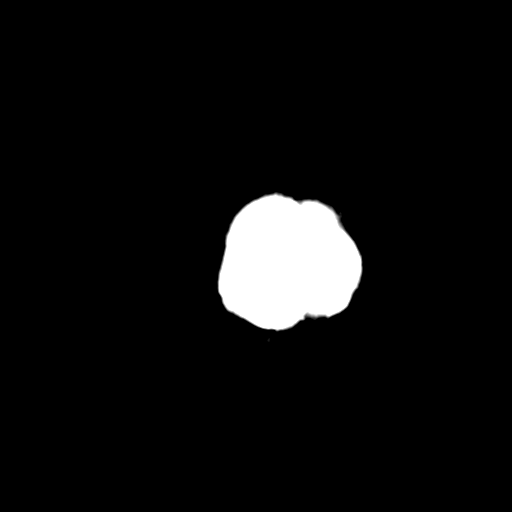

[Series 4: head wo coronal · coronal · 0.32mm/px · 3 of 67 slices shown]
[im 23/67  brain]
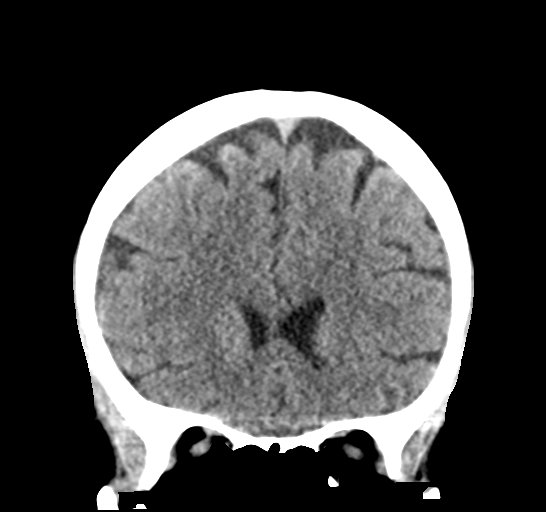
[im 30/67  brain]
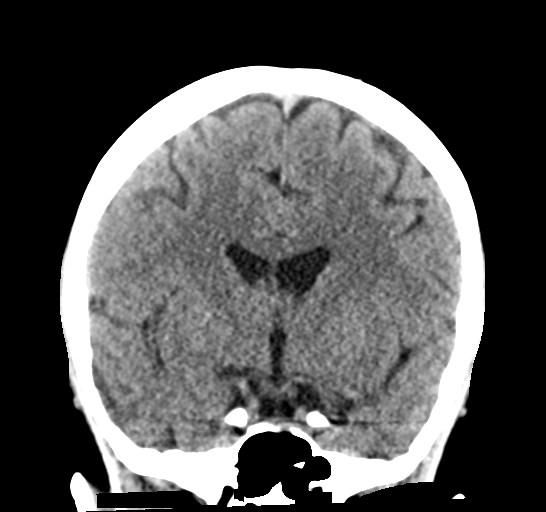
[im 37/67  brain]
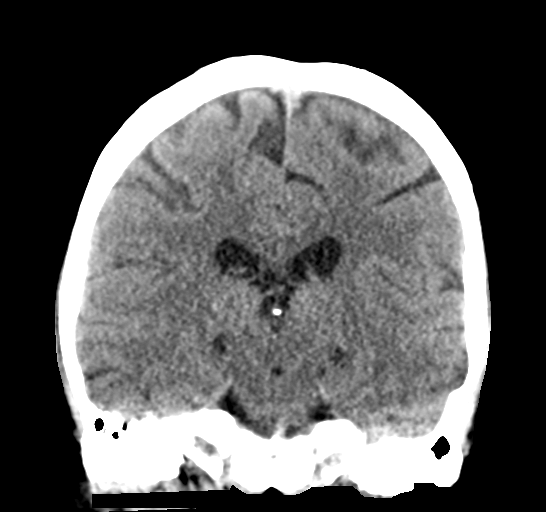

[Series 5: head wo sagittal · sagittal · 0.30mm/px · 3 of 57 slices shown]
[im 19/57  brain]
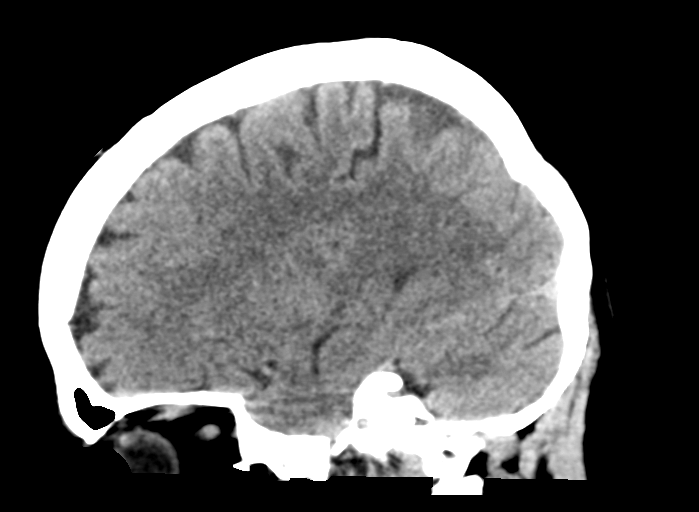
[im 29/57  brain]
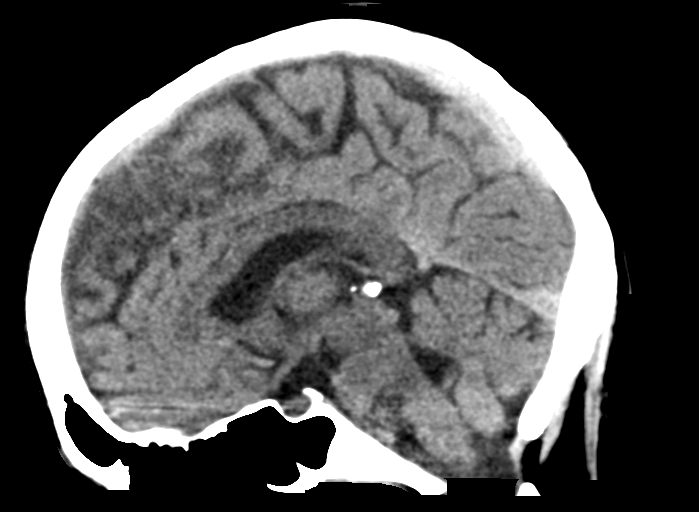
[im 38/57  brain]
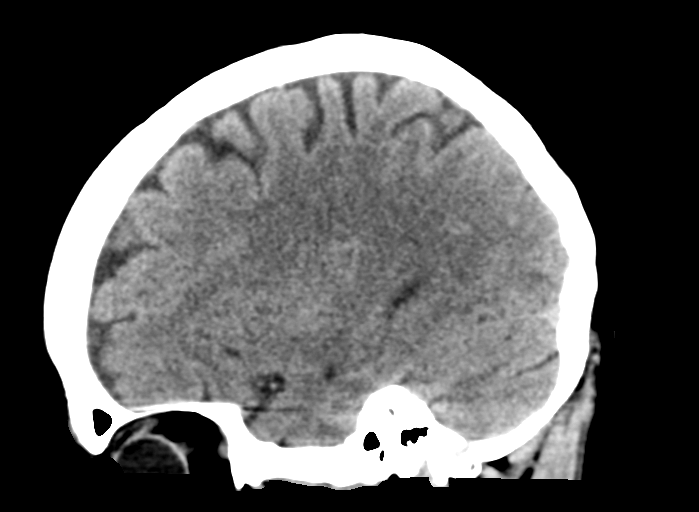

[16 of 47 positions shown; findings below may reference images not displayed]

FINDINGS: Brain: No evidence of acute infarction, hemorrhage, hydrocephalus,
extra-axial collection or mass lesion/mass effect.

Vascular: No hyperdense vessel or unexpected calcification.

Skull: Normal. Negative for fracture or focal lesion.

Sinuses/Orbits: No acute finding.

Other: None.
IMPRESSION: No acute intracranial abnormality.

## 2023-08-24 ENCOUNTER — Ambulatory Visit (HOSPITAL_COMMUNITY): Payer: Self-pay | Admitting: Psychiatry
